# Patient Record
Sex: Female | Born: 1985 | Race: White | Hispanic: No | Smoking: Former smoker
Health system: Southern US, Community
[De-identification: ages and names within clinical notes are randomized; demographics above are authoritative.]

## PROBLEM LIST (undated history)

## (undated) ENCOUNTER — Inpatient Hospital Stay (HOSPITAL_COMMUNITY): Payer: Self-pay

## (undated) DIAGNOSIS — J45909 Unspecified asthma, uncomplicated: Secondary | ICD-10-CM

## (undated) DIAGNOSIS — Z8489 Family history of other specified conditions: Secondary | ICD-10-CM

## (undated) HISTORY — PX: CYSTECTOMY: SUR359

---

## 2007-06-23 ENCOUNTER — Observation Stay: Payer: Self-pay

## 2007-06-28 ENCOUNTER — Inpatient Hospital Stay: Payer: Self-pay | Admitting: Obstetrics and Gynecology

## 2009-01-04 ENCOUNTER — Observation Stay: Payer: Self-pay

## 2009-01-07 ENCOUNTER — Inpatient Hospital Stay: Payer: Self-pay

## 2009-05-11 ENCOUNTER — Emergency Department: Payer: Self-pay | Admitting: Emergency Medicine

## 2010-02-18 ENCOUNTER — Ambulatory Visit: Payer: Self-pay | Admitting: Family Medicine

## 2011-01-01 ENCOUNTER — Emergency Department: Payer: Self-pay | Admitting: Emergency Medicine

## 2012-02-10 ENCOUNTER — Emergency Department: Payer: Self-pay | Admitting: Emergency Medicine

## 2012-02-10 LAB — COMPREHENSIVE METABOLIC PANEL
Albumin: 4.1 g/dL (ref 3.4–5.0)
Alkaline Phosphatase: 102 U/L (ref 50–136)
Anion Gap: 13 (ref 7–16)
BUN: 11 mg/dL (ref 7–18)
Bilirubin,Total: 0.5 mg/dL (ref 0.2–1.0)
Calcium, Total: 9 mg/dL (ref 8.5–10.1)
Chloride: 107 mmol/L (ref 98–107)
Co2: 17 mmol/L — ABNORMAL LOW (ref 21–32)
Creatinine: 0.83 mg/dL (ref 0.60–1.30)
EGFR (African American): >60
EGFR (Non-African Amer.): >60
Glucose: 112 mg/dL — ABNORMAL HIGH (ref 65–99)
Osmolality: 274 (ref 275–301)
Potassium: 3.5 mmol/L (ref 3.5–5.1)
SGOT(AST): 16 U/L (ref 15–37)
SGPT (ALT): 20 U/L (ref 12–78)
Sodium: 137 mmol/L (ref 136–145)
Total Protein: 8.5 g/dL — ABNORMAL HIGH (ref 6.4–8.2)

## 2012-02-10 LAB — CBC
HCT: 43.4 % (ref 35.0–47.0)
HGB: 15.1 g/dL (ref 12.0–16.0)
MCH: 30.8 pg (ref 26.0–34.0)
MCHC: 34.7 g/dL (ref 32.0–36.0)
MCV: 89 fL (ref 80–100)
Platelet: 265 10*3/uL (ref 150–440)
RBC: 4.89 10*6/uL (ref 3.80–5.20)
RDW: 13.4 % (ref 11.5–14.5)
WBC: 13.6 10*3/uL — ABNORMAL HIGH (ref 3.6–11.0)

## 2012-02-10 LAB — URINALYSIS, COMPLETE
Bilirubin,UR: NEGATIVE
Blood: NEGATIVE
Glucose,UR: NEGATIVE mg/dL (ref 0–75)
Ketone: NEGATIVE
Nitrite: NEGATIVE
Specific Gravity: 1.009 (ref 1.003–1.030)
Squamous Epithelial: 1
WBC UR: 2 /HPF (ref 0–5)

## 2012-02-10 LAB — RAPID INFLUENZA A&B ANTIGENS

## 2013-06-23 ENCOUNTER — Emergency Department: Payer: Self-pay | Admitting: Emergency Medicine

## 2013-06-23 LAB — BASIC METABOLIC PANEL
Anion Gap: 4 — ABNORMAL LOW (ref 7–16)
BUN: 8 mg/dL (ref 7–18)
CALCIUM: 9.1 mg/dL (ref 8.5–10.1)
CREATININE: 1.02 mg/dL (ref 0.60–1.30)
Chloride: 106 mmol/L (ref 98–107)
Co2: 28 mmol/L (ref 21–32)
EGFR (Non-African Amer.): >60
Glucose: 90 mg/dL (ref 65–99)
Osmolality: 274 (ref 275–301)
Potassium: 3.5 mmol/L (ref 3.5–5.1)
SODIUM: 138 mmol/L (ref 136–145)

## 2013-06-23 LAB — TROPONIN I: Troponin-I: <0.02 ng/mL

## 2013-06-23 LAB — CBC
HCT: 42.3 % (ref 35.0–47.0)
HGB: 14.4 g/dL (ref 12.0–16.0)
MCH: 30.7 pg (ref 26.0–34.0)
MCHC: 34.1 g/dL (ref 32.0–36.0)
MCV: 90 fL (ref 80–100)
Platelet: 214 10*3/uL (ref 150–440)
RBC: 4.69 10*6/uL (ref 3.80–5.20)
RDW: 13.6 % (ref 11.5–14.5)
WBC: 9.1 10*3/uL (ref 3.6–11.0)

## 2013-06-23 LAB — D-DIMER(ARMC): D-Dimer: 314 ng/ml

## 2013-12-16 ENCOUNTER — Inpatient Hospital Stay (HOSPITAL_COMMUNITY)
Admission: AD | Admit: 2013-12-16 | Discharge: 2013-12-16 | Disposition: A | Discharge status: Home / Self Care | Payer: Medicare Other | Source: Ambulatory Visit | Attending: Obstetrics & Gynecology | Admitting: Obstetrics & Gynecology

## 2013-12-16 ENCOUNTER — Encounter (HOSPITAL_COMMUNITY): Payer: Self-pay

## 2013-12-16 ENCOUNTER — Inpatient Hospital Stay (HOSPITAL_COMMUNITY): Payer: Medicare Other

## 2013-12-16 DIAGNOSIS — O9989 Other specified diseases and conditions complicating pregnancy, childbirth and the puerperium: Principal | ICD-10-CM

## 2013-12-16 DIAGNOSIS — S93402A Sprain of unspecified ligament of left ankle, initial encounter: Secondary | ICD-10-CM

## 2013-12-16 DIAGNOSIS — S8990XA Unspecified injury of unspecified lower leg, initial encounter: Secondary | ICD-10-CM | POA: Insufficient documentation

## 2013-12-16 DIAGNOSIS — W108XXA Fall (on) (from) other stairs and steps, initial encounter: Secondary | ICD-10-CM | POA: Diagnosis not present

## 2013-12-16 DIAGNOSIS — S99919A Unspecified injury of unspecified ankle, initial encounter: Secondary | ICD-10-CM

## 2013-12-16 DIAGNOSIS — Z87891 Personal history of nicotine dependence: Secondary | ICD-10-CM | POA: Insufficient documentation

## 2013-12-16 DIAGNOSIS — S99929A Unspecified injury of unspecified foot, initial encounter: Secondary | ICD-10-CM

## 2013-12-16 DIAGNOSIS — O99891 Other specified diseases and conditions complicating pregnancy: Secondary | ICD-10-CM | POA: Insufficient documentation

## 2013-12-16 NOTE — MAU Provider Note (Signed)
  History     CSN: [id number]  Arrival date and time: 12/16/13 1540   First Provider Initiated Contact with Patient 12/16/13 1900      Chief Complaint  Patient presents with  . Ankle Pain   HPI Ashley Fernandez is a 28 y.o. [id number] at [redacted] wks EGA. She fell off 1 step this am, was ok, now is swollen and tender. [place]NC [place].    OB History   Grav Para Term Preterm Abortions TAB SAB Ect Mult Living   3 2 0 2      2      History reviewed. No pertinent past medical history.  Past Surgical History  Procedure Laterality Date  . Cystectomy      History reviewed. No pertinent family history.  History  Substance Use Topics  . Smoking status: Former Games developer  . Smokeless tobacco: Not on file  . Alcohol Use: Yes     Comment: LAST DRANK  AT 6 WEEKS AGO    Allergies:  Allergies  Allergen Reactions  . Peanuts [Peanut Oil] Anaphylaxis    Prescriptions prior to admission  Medication Sig Dispense Refill  . albuterol (PROVENTIL HFA;VENTOLIN HFA) 108 (90 BASE) MCG/ACT inhaler Inhale 2 puffs into the lungs every 6 (six) hours as needed for wheezing or shortness of breath.      . EPINEPHrine (EPIPEN 2-PAK) 0.3 mg/0.3 mL IJ SOAJ injection Inject 0.3 mg into the muscle once.      . Prenatal Vit-Fe Fumarate-FA (PRENATAL MULTIVITAMIN) TABS tablet Take 1 tablet by mouth daily at 12 noon.      Marland Kitchen PRESCRIPTION MEDICATION Inhale 2 puffs into the lungs 2 (two) times daily. Pt reports using an inhaler twice daily w/spacer, unknown drug.        Review of Systems  Constitutional: Negative for fever and chills.  Gastrointestinal: Negative for abdominal pain.  Genitourinary: Negative for dysuria, urgency and frequency.  Musculoskeletal: Positive for falls.       L ankle tender, swollen   Physical Exam   Blood pressure 113/64, pulse 80, temperature 98.4 F (36.9 C), temperature source Oral, resp. rate 18, weight 104.327 kg (230 lb), last menstrual period 10/01/2013.  Physical Exam   Nursing note and vitals reviewed. Constitutional: She is oriented to person, place, and time. She appears well-developed and well-nourished.  Musculoskeletal:       Left foot: She exhibits decreased range of motion, tenderness and swelling. She exhibits no deformity and no laceration.  Neurological: She is alert and oriented to person, place, and time.  Skin: Skin is warm and dry.  Psychiatric: She has a normal mood and affect. Her behavior is normal.    MAU Course  Procedures  MDM   Assessment and Plan  L ankle injury-not fractured [redacted] wk EGA   Ice, elevate, rest, tylenol Keep appt for pnc If foot gets worse t f/u with PCP  [name] 12/16/2013, 7:05 PM

## 2013-12-16 NOTE — MAU Note (Addendum)
PT  SAYS AT 0900-    AT A HOUSE- SHE  FELL OFF 1 STEP   AND HURT LEFT ANKLE.     DID NOT HIT HER HEAD-  TRIED TO  CATCH HERSELF  WITH HER HANDS.      ON ARRIVAL  LEFT ANKLE SWOLLEN-     SAYS SHE GETS PNC  IN [place]-   LIVES IN [place]

## 2013-12-16 NOTE — MAU Note (Signed)
Pt states here for swollen left ankle. Fell down steps around 0900 this am, landed on abdomen. Went to football game and ankle was ok, however after going back home began having pain and swelling. Denies bleeding or abnormal vaginal discharge. Has had intermittent abdominal cramping.

## 2014-01-04 ENCOUNTER — Encounter: Payer: Self-pay | Admitting: Obstetrics & Gynecology

## 2014-01-30 ENCOUNTER — Encounter (HOSPITAL_COMMUNITY): Payer: Self-pay

## 2014-02-08 ENCOUNTER — Encounter: Payer: Self-pay | Admitting: Obstetrics and Gynecology

## 2014-04-05 ENCOUNTER — Observation Stay: Payer: Self-pay

## 2014-04-05 LAB — URINALYSIS, COMPLETE
Bilirubin,UR: NEGATIVE
Blood: NEGATIVE
GLUCOSE, UR: NEGATIVE mg/dL (ref 0–75)
KETONE: NEGATIVE
NITRITE: NEGATIVE
PH: 5 (ref 4.5–8.0)
Protein: NEGATIVE
RBC,UR: 22 /HPF (ref 0–5)
Specific Gravity: 1.014 (ref 1.003–1.030)
WBC UR: 19 /HPF (ref 0–5)

## 2014-04-19 ENCOUNTER — Encounter: Payer: Self-pay | Admitting: Obstetrics & Gynecology

## 2014-05-15 ENCOUNTER — Observation Stay: Payer: Self-pay

## 2014-06-18 ENCOUNTER — Observation Stay: Payer: Self-pay

## 2014-06-27 ENCOUNTER — Observation Stay
Admit: 2014-06-27 | Disposition: A | Discharge status: Home / Self Care | Payer: Self-pay | Attending: Certified Nurse Midwife | Admitting: Certified Nurse Midwife

## 2014-06-27 LAB — PIH PROFILE
Anion Gap: 8 (ref 7–16)
BUN: 8 mg/dL
Calcium, Total: 8.7 mg/dL — ABNORMAL LOW
Chloride: 108 mmol/L
Co2: 22 mmol/L
Creatinine: 0.58 mg/dL
EGFR (African American): >60
EGFR (Non-African Amer.): >60
GLUCOSE: 130 mg/dL — AB
HCT: 37.1 % (ref 35.0–47.0)
HGB: 12.4 g/dL (ref 12.0–16.0)
MCH: 31.4 pg (ref 26.0–34.0)
MCHC: 33.5 g/dL (ref 32.0–36.0)
MCV: 94 fL (ref 80–100)
POTASSIUM: 3.6 mmol/L
Platelet: 204 10*3/uL (ref 150–440)
RBC: 3.97 10*6/uL (ref 3.80–5.20)
RDW: 14.2 % (ref 11.5–14.5)
SGOT(AST): 24 U/L
Sodium: 138 mmol/L
Uric Acid: 4.2 mg/dL
WBC: 9.2 10*3/uL (ref 3.6–11.0)

## 2014-06-27 LAB — PROTEIN / CREATININE RATIO, URINE
Creatinine, Urine Random: 177 mg/dL (ref 30–125)
PROTEIN/CREAT. RATIO: 130 mg/g{creat} (ref 0–200)
Protein, Urine: 23 mg/dL (ref 0–9)

## 2014-07-09 ENCOUNTER — Observation Stay
Admit: 2014-07-09 | Disposition: A | Discharge status: Home / Self Care | Payer: Self-pay | Attending: Advanced Practice Midwife | Admitting: Advanced Practice Midwife

## 2014-07-09 LAB — PROTEIN / CREATININE RATIO, URINE
CREATININE, URINE RANDOM: 247 mg/dL (ref 30–125)
PROTEIN, URINE: 43 mg/dL (ref 0–9)
PROTEIN/CREAT. RATIO: 174 mg/g{creat} (ref 0–200)

## 2014-07-09 LAB — PIH PROFILE
Anion Gap: 7 (ref 7–16)
BUN: 7 mg/dL
CALCIUM: 8.7 mg/dL — AB
CO2: 25 mmol/L
CREATININE: 0.63 mg/dL
Chloride: 105 mmol/L
EGFR (African American): >60
GLUCOSE: 124 mg/dL — AB
HCT: 37.2 % (ref 35.0–47.0)
HGB: 12.5 g/dL (ref 12.0–16.0)
MCH: 31.7 pg (ref 26.0–34.0)
MCHC: 33.7 g/dL (ref 32.0–36.0)
MCV: 94 fL (ref 80–100)
Platelet: 193 10*3/uL (ref 150–440)
Potassium: 3.5 mmol/L
RBC: 3.95 10*6/uL (ref 3.80–5.20)
RDW: 14.4 % (ref 11.5–14.5)
SGOT(AST): 17 U/L
SODIUM: 137 mmol/L
URIC ACID: 4.2 mg/dL
WBC: 9.6 10*3/uL (ref 3.6–11.0)

## 2014-07-10 ENCOUNTER — Inpatient Hospital Stay
Admit: 2014-07-10 | Disposition: A | Discharge status: Home / Self Care | Payer: Self-pay | Attending: Certified Nurse Midwife | Admitting: Certified Nurse Midwife

## 2014-07-10 LAB — CBC WITH DIFFERENTIAL/PLATELET
Basophil #: 0 10*3/uL (ref 0.0–0.1)
Basophil %: 0.2 %
EOS PCT: 0.3 %
Eosinophil #: 0 10*3/uL (ref 0.0–0.7)
HCT: 36.6 % (ref 35.0–47.0)
HGB: 12.3 g/dL (ref 12.0–16.0)
LYMPHS ABS: 1.3 10*3/uL (ref 1.0–3.6)
Lymphocyte %: 13 %
MCH: 31.4 pg (ref 26.0–34.0)
MCHC: 33.6 g/dL (ref 32.0–36.0)
MCV: 93 fL (ref 80–100)
MONO ABS: 0.5 x10 3/mm (ref 0.2–0.9)
Monocyte %: 4.9 %
NEUTROS ABS: 8.2 10*3/uL — AB (ref 1.4–6.5)
Neutrophil %: 81.6 %
Platelet: 194 10*3/uL (ref 150–440)
RBC: 3.92 10*6/uL (ref 3.80–5.20)
RDW: 14.5 % (ref 11.5–14.5)
WBC: 10 10*3/uL (ref 3.6–11.0)

## 2014-07-12 LAB — HEMATOCRIT: HCT: 33.8 % — ABNORMAL LOW (ref 35.0–47.0)

## 2014-08-07 NOTE — H&P (Signed)
L&D Evaluation:  History:  HPI Pt is a 29 yo G3P2002 pt of [facility] at 2684w1d by a 2952w6d scan with an EDC of 07/08/14 who presents to L&D with reports of decreased fetal movement. She report not doing any fetal movement counts. She denies vb, lof.  She denies dysuria, vomiting, fever, chills, lof, and back pain.  Her history is significant for learning disability, BMI of 36, asthma, +CT and +GC with negative TOC. She is A+, RI, VI.   Presents with abdominal pain   Patient's Medical History Asthma  learning disability, obesity, allergies   Patient's Surgical History cyst removal   Medications Pre Natal Vitamins   Allergies peanuts   Social History none   Family History Non-Contributory   ROS:  ROS All systems were reviewed.  HEENT, CNS, GI, GU, Respiratory, CV, Renal and Musculoskeletal systems were found to be normal.   Exam:  Vital Signs stable   General no apparent distress   Mental Status clear   Chest clear   Heart normal sinus rhythm   Abdomen gravid, non-tender   Mebranes Intact   FHT normal rate with no decels, 135, moderate variability, +accels, no decels   Ucx irregular, very mild   Skin dry, no lesions, no rashes   Lymph no lymphadenopathy   Impression:  Impression reactive NST, IUP at 37.1, Cat 1 FHT   Plan:  Plan UA, discharge, FKC discussedd with pt   Follow Up Appointment already scheduled. in 1 week   Electronic Signatures: Jannet MantisSubudhi, Jarrod Mcenery (CNM)  (Signed 21-Mar-16 20:13)  Authored: L&D Evaluation   Last Updated: 21-Mar-16 20:13 by Jannet MantisSubudhi, Tiziana Cislo (CNM)

## 2014-08-07 NOTE — H&P (Signed)
L&D Evaluation:  History:  HPI Pt is a 29 yo G3P2002 pt of [facility] at 3567w4d by a 3739w6d scan with an EDC of 07/08/14 who presents to L&D with reports of worsening middle abdominal pain, lack of sleep, and pain from the "baby stretching." She reports that she has never felt contractions with her other 2 children because her labors were induced and she had an epidural. She denies dysuria, vomiting, fever, chills, lof, and back pain. She reports + FM and nausea. She states that she "just can't get any sleep" and she has tried the "sleep tea the doctor gave me." She reports drinking fluid and laying down on her side to see if the abdominal tightening would go away but is still keeps happening intermittently.  Her history is significant for learning disability, BMI of 36, asthma, +CT and +GC with negative TOC. She is A+, RI, VI.   Presents with abdominal pain   Patient's Medical History Asthma  learning disability, obesity, allergies   Patient's Surgical History cyst removal   Medications Pre Natal Vitamins   Allergies peanuts   Social History none   Family History Non-Contributory   ROS:  ROS All systems were reviewed.  HEENT, CNS, GI, GU, Respiratory, CV, Renal and Musculoskeletal systems were found to be normal.   Exam:  Vital Signs stable   General no apparent distress   Mental Status clear   Chest clear   Heart normal sinus rhythm   Abdomen gravid, non-tender   Mebranes Intact   FHT normal rate with no decels, 140-141   Ucx absent   Skin dry, no lesions, no rashes   Lymph no lymphadenopathy   Impression:  Impression IUP at 26.4, probable braxton hicks contractions, R/O UTI   Plan:  Plan UA, comfort measures for sleep, and braxton hicks discussed. Encouraged pt to call the office to be seen for non-emergent visits.   Follow Up Appointment already scheduled   Electronic Signatures: Jannet MantisSubudhi, Mylz Yuan (CNM)  (Signed 07-Jan-16 11:14)  Authored: L&D  Evaluation   Last Updated: 07-Jan-16 11:14 by Jannet MantisSubudhi, Haadiya Frogge (CNM)

## 2014-08-07 NOTE — H&P (Signed)
L&D Evaluation:  History Expanded:  HPI Pt is a 29 yo G3P2002 pt of [facility] at 659w1d with EDD of 07/08/14 per LMP and 3016w6d scan who presents to L&D after being sent over from [facility] with elevated bp of 140/90 and 1+ protein urine dip. She denies HA, blurred vision, RUQ pain. She also reports decreased fetal movement. Her history is significant for learning disability, BMI of 36, asthma, +CT and +GC with negative TOC. She is A+, RI, VI, GBS positive   Presents with PIH work up   Patient's Medical History Asthma  learning disability, obesity, allergies   Patient's Surgical History cyst removal   Medications Pre Natal Vitamins   Allergies peanuts   Social History none   Family History Non-Contributory   ROS:  ROS All systems were reviewed.  HEENT, CNS, GI, GU, Respiratory, CV, Renal and Musculoskeletal systems were found to be normal.   Exam:  Vital Signs stable  BP 125-134/70-81   Urine Protein trace   General no apparent distress   Mental Status clear   Chest clear   Heart normal sinus rhythm   Abdomen gravid, non-tender   Edema no edema   Reflexes 2+   Pelvic no external lesions, 2-3/60/-2   Mebranes Intact   FHT normal rate with no decels, 135-140, moderate variability, +accels, no decels   Ucx irregular, occassional   Skin dry, no lesions, no rashes   Lymph no lymphadenopathy   Other H&H- 12.5/37.2, plt- 193, sgot- 17, uric acid- 4.2, pc ratio 174   Impression:  Impression reactive NST, IUP at 2559w1d, no evidence of hypertension or pre-eclampsia   Plan:  Comments Discharge home - pre-eclampsia precautions F/u as scheduled at office for IOL H&P - put on L&D schedule for 4/16 am   Follow Up Appointment already scheduled. Thursday at [facility]   Electronic Signatures: Vella KohlerBrothers, Landy Dunnavant K (CNM)  (Signed 11-Apr-16 15:14)  Authored: L&D Evaluation   Last Updated: 11-Apr-16 15:14 by Vella KohlerBrothers, Wendy Hoback K (CNM)
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
## 2014-08-07 NOTE — H&P (Signed)
L&D Evaluation:  History:  HPI Pt is a 29 yo G3P2002 pt of [facility] at 6564w3d by a 1839w6d scan with an EDC of 07/08/14 who presents to L&D after being sent over from [facility] with elevated bp of 134/95. She denies HA, blurred vision, RUQ pain. She also reports decreased fetal movement. Her history is significant for learning disability, BMI of 36, asthma, +CT and +GC with negative TOC. She is A+, RI, VI.   Presents with PIH work up   Patient's Medical History Asthma  learning disability, obesity, allergies   Patient's Surgical History cyst removal   Medications Pre Natal Vitamins   Allergies peanuts   Social History none   Family History Non-Contributory   ROS:  ROS All systems were reviewed.  HEENT, CNS, GI, GU, Respiratory, CV, Renal and Musculoskeletal systems were found to be normal.   Exam:  Vital Signs stable  bp 92-129/48-100  one BP 116/100 pt was thrashing in the bed.   General no apparent distress   Mental Status clear   Chest clear   Heart normal sinus rhythm   Abdomen gravid, non-tender   Reflexes 2+   Mebranes Intact   FHT normal rate with no decels, 135-140, moderate variability, +accels, no decels   Ucx irregular, occassional   Skin dry, no lesions, no rashes   Lymph no lymphadenopathy   Other H&H- 12.4/37.1, plt- 204, sgot- 24, uric acid- 4.2, pc ratio 0.13   Impression:  Impression reactive NST, IUP at 38.3, Cat 1 FHT, isolated elevated BP reading   Plan:  Plan UA, discharge, FKC discussedd with pt   Comments discussed s/sx of pre eclampsia. Discussed dx of GHTN requires 2 elevated BP 4 hours apart. Pt to have appt with [facility] tomorrow. If BP elevated- will need to be set up for IOL for GHTN.   Follow Up Appointment already scheduled. tomorrow with [facility]   Electronic Signatures: Jannet MantisSubudhi, Deeana Atwater (CNM)  (Signed 30-Mar-16 18:49)  Authored: L&D Evaluation   Last Updated: 30-Mar-16 18:49 by Jannet MantisSubudhi, Kennedey Digilio (CNM)

## 2015-01-14 ENCOUNTER — Other Ambulatory Visit: Payer: Medicare Other

## 2015-01-15 ENCOUNTER — Other Ambulatory Visit: Payer: Medicare Other

## 2015-01-22 ENCOUNTER — Encounter
Admission: RE | Admit: 2015-01-22 | Discharge: 2015-01-22 | Disposition: A | Discharge status: Home / Self Care | Payer: Medicare Other | Source: Ambulatory Visit | Attending: Obstetrics and Gynecology | Admitting: Obstetrics and Gynecology

## 2015-01-22 DIAGNOSIS — N9971 Accidental puncture and laceration of a genitourinary system organ or structure during a genitourinary system procedure: Secondary | ICD-10-CM | POA: Diagnosis not present

## 2015-01-22 DIAGNOSIS — Z6841 Body Mass Index (BMI) 40.0 and over, adult: Secondary | ICD-10-CM | POA: Diagnosis not present

## 2015-01-22 DIAGNOSIS — Z87891 Personal history of nicotine dependence: Secondary | ICD-10-CM | POA: Diagnosis not present

## 2015-01-22 DIAGNOSIS — J45909 Unspecified asthma, uncomplicated: Secondary | ICD-10-CM

## 2015-01-22 DIAGNOSIS — N83209 Unspecified ovarian cyst, unspecified side: Secondary | ICD-10-CM | POA: Diagnosis present

## 2015-01-22 DIAGNOSIS — E669 Obesity, unspecified: Secondary | ICD-10-CM | POA: Diagnosis not present

## 2015-01-22 DIAGNOSIS — Z302 Encounter for sterilization: Secondary | ICD-10-CM | POA: Diagnosis not present

## 2015-01-22 HISTORY — DX: Family history of other specified conditions: Z84.89

## 2015-01-22 HISTORY — DX: Unspecified asthma, uncomplicated: J45.909

## 2015-01-22 LAB — BASIC METABOLIC PANEL
Anion gap: 8 (ref 5–15)
BUN: 10 mg/dL (ref 6–20)
CALCIUM: 8.9 mg/dL (ref 8.9–10.3)
CO2: 25 mmol/L (ref 22–32)
Chloride: 110 mmol/L (ref 101–111)
Creatinine, Ser: 0.91 mg/dL (ref 0.44–1.00)
Glucose, Bld: 96 mg/dL (ref 65–99)
Potassium: 3.4 mmol/L — ABNORMAL LOW (ref 3.5–5.1)
SODIUM: 143 mmol/L (ref 135–145)

## 2015-01-22 LAB — CBC
HCT: 39.9 % (ref 35.0–47.0)
Hemoglobin: 13.4 g/dL (ref 12.0–16.0)
MCH: 30 pg (ref 26.0–34.0)
MCHC: 33.5 g/dL (ref 32.0–36.0)
MCV: 89.7 fL (ref 80.0–100.0)
PLATELETS: 230 10*3/uL (ref 150–440)
RBC: 4.45 MIL/uL (ref 3.80–5.20)
RDW: 13.4 % (ref 11.5–14.5)
WBC: 8.1 10*3/uL (ref 3.6–11.0)

## 2015-01-22 NOTE — Patient Instructions (Signed)
  Your procedure is scheduled on: 01/25/15 ARRIVAL TIME 1:00 PM Report to Day Surgery. MEDICAL MALL  Remember: Instructions that are not followed completely may result in serious medical risk, up to and including death, or upon the discretion of your surgeon and anesthesiologist your surgery may need to be rescheduled.    _X___ 1. Do not eat food or drink liquids after midnight. No gum chewing or hard candies.     _X___ 2. No Alcohol for 24 hours before or after surgery.   ____ 3. Bring all medications with you on the day of surgery if instructed.    ____ 4. Notify your doctor if there is any change in your medical condition     (cold, fever, infections).     Do not wear jewelry, make-up, hairpins, clips or nail polish.  Do not wear lotions, powders, or perfumes. You may wear deodorant.  Do not shave 48 hours prior to surgery. Men may shave face and neck.  Do not bring valuables to the hospital.    [facility] not [facility] for any belongings or valuables.               Contacts, dentures or bridgework may not be worn into surgery.  Leave your suitcase in the car. After surgery it may be brought to your room.  For patients admitted to the hospital, discharge time is determined by your                treatment team.   Patients discharged the day of surgery will not be allowed to drive home.   Please read over the following fact sheets that you were given:   Surgical Site Infection Prevention   ____ Take these medicines the morning of surgery with A SIP OF WATER:    1. NONE  2.   3.   4.  5.  6.  ____ Fleet Enema (as directed)   __X__ Use CHG Soap as directed  _X___ Use inhalers on the day of surgery  ____ Stop metformin 2 days prior to surgery    ____ Take 1/2 of usual insulin dose the night before surgery and none on the morning of surgery.   ____ Stop Coumadin/Plavix/aspirin on  ____ Stop Anti-inflammatories on   ____ Stop supplements until after surgery.     ____ Bring C-Pap to the hospital.

## 2015-01-25 ENCOUNTER — Ambulatory Visit: Payer: Medicare Other

## 2015-01-25 ENCOUNTER — Ambulatory Visit: Payer: Medicare Other | Admitting: *Deleted

## 2015-01-25 ENCOUNTER — Encounter: Payer: Self-pay | Admitting: *Deleted

## 2015-01-25 ENCOUNTER — Ambulatory Visit: Admission: RE | Admit: 2015-01-25 | Payer: Medicare Other | Source: Ambulatory Visit

## 2015-01-25 ENCOUNTER — Encounter
Admission: RE | Disposition: A | Discharge status: Home / Self Care | Payer: Self-pay | Source: Ambulatory Visit | Attending: Obstetrics and Gynecology

## 2015-01-25 ENCOUNTER — Ambulatory Visit
Admission: RE | Admit: 2015-01-25 | Discharge: 2015-01-25 | Disposition: A | Discharge status: Home / Self Care | Payer: Medicare Other | Source: Ambulatory Visit | Attending: Obstetrics and Gynecology | Admitting: Obstetrics and Gynecology

## 2015-01-25 DIAGNOSIS — Z01818 Encounter for other preprocedural examination: Secondary | ICD-10-CM

## 2015-01-25 DIAGNOSIS — N83209 Unspecified ovarian cyst, unspecified side: Secondary | ICD-10-CM | POA: Insufficient documentation

## 2015-01-25 DIAGNOSIS — Z6841 Body Mass Index (BMI) 40.0 and over, adult: Secondary | ICD-10-CM | POA: Insufficient documentation

## 2015-01-25 DIAGNOSIS — Z87891 Personal history of nicotine dependence: Secondary | ICD-10-CM | POA: Insufficient documentation

## 2015-01-25 DIAGNOSIS — E669 Obesity, unspecified: Secondary | ICD-10-CM | POA: Insufficient documentation

## 2015-01-25 DIAGNOSIS — Z302 Encounter for sterilization: Secondary | ICD-10-CM | POA: Diagnosis not present

## 2015-01-25 DIAGNOSIS — N9971 Accidental puncture and laceration of a genitourinary system organ or structure during a genitourinary system procedure: Secondary | ICD-10-CM | POA: Insufficient documentation

## 2015-01-25 DIAGNOSIS — J45909 Unspecified asthma, uncomplicated: Secondary | ICD-10-CM | POA: Insufficient documentation

## 2015-01-25 HISTORY — PX: LAPAROSCOPIC TUBAL LIGATION: SHX1937

## 2015-01-25 LAB — POCT PREGNANCY, URINE: PREG TEST UR: NEGATIVE

## 2015-01-25 SURGERY — LIGATION, FALLOPIAN TUBE, LAPAROSCOPIC
Anesthesia: General | Laterality: Bilateral | Wound class: Clean Contaminated

## 2015-01-25 MED ORDER — SILVER NITRATE-POT NITRATE 75-25 % EX MISC
CUTANEOUS | Status: DC | PRN
  Administered 2015-01-25: 5

## 2015-01-25 MED ORDER — IBUPROFEN 600 MG PO TABS
ORAL_TABLET | ORAL | Status: AC
  Filled 2015-01-25: qty 1

## 2015-01-25 MED ORDER — LACTATED RINGERS IV SOLN
INTRAVENOUS | Status: DC
  Administered 2015-01-25: 13:00:00 via INTRAVENOUS

## 2015-01-25 MED ORDER — SUCCINYLCHOLINE CHLORIDE 20 MG/ML IJ SOLN
INTRAMUSCULAR | Status: DC | PRN
  Administered 2015-01-25: 100 mg via INTRAVENOUS

## 2015-01-25 MED ORDER — ROCURONIUM BROMIDE 100 MG/10ML IV SOLN
INTRAVENOUS | Status: DC | PRN
  Administered 2015-01-25: 10 mg via INTRAVENOUS
  Administered 2015-01-25: 20 mg via INTRAVENOUS

## 2015-01-25 MED ORDER — FENTANYL CITRATE (PF) 100 MCG/2ML IJ SOLN
INTRAMUSCULAR | Status: AC
  Filled 2015-01-25: qty 2

## 2015-01-25 MED ORDER — ONDANSETRON HCL 4 MG/2ML IJ SOLN: 4.0000 mg | Freq: Once | INTRAMUSCULAR | Status: DC | PRN

## 2015-01-25 MED ORDER — FENTANYL CITRATE (PF) 250 MCG/5ML IJ SOLN
INTRAMUSCULAR | Status: DC | PRN
  Administered 2015-01-25: 100 ug via INTRAVENOUS
  Administered 2015-01-25: 50 ug via INTRAVENOUS

## 2015-01-25 MED ORDER — MIDAZOLAM HCL 5 MG/5ML IJ SOLN
INTRAMUSCULAR | Status: DC | PRN
  Administered 2015-01-25: 2 mg via INTRAVENOUS

## 2015-01-25 MED ORDER — HYDROMORPHONE HCL 1 MG/ML IJ SOLN
INTRAMUSCULAR | Status: AC
  Administered 2015-01-25: 0.5 mg via INTRAVENOUS
  Filled 2015-01-25: qty 1

## 2015-01-25 MED ORDER — ALBUTEROL SULFATE HFA 108 (90 BASE) MCG/ACT IN AERS
INHALATION_SPRAY | RESPIRATORY_TRACT | Status: DC | PRN
  Administered 2015-01-25 (×3): 4 via RESPIRATORY_TRACT

## 2015-01-25 MED ORDER — HYDROMORPHONE HCL 1 MG/ML IJ SOLN
0.2500 mg | INTRAMUSCULAR | Status: DC | PRN
  Administered 2015-01-25 (×4): 0.5 mg via INTRAVENOUS

## 2015-01-25 MED ORDER — BUPIVACAINE HCL 0.5 % IJ SOLN
INTRAMUSCULAR | Status: DC | PRN
  Administered 2015-01-25: 15 mL

## 2015-01-25 MED ORDER — DEXAMETHASONE SODIUM PHOSPHATE 10 MG/ML IJ SOLN
INTRAMUSCULAR | Status: DC | PRN
  Administered 2015-01-25: 10 mg via INTRAVENOUS

## 2015-01-25 MED ORDER — ALBUTEROL SULFATE (2.5 MG/3ML) 0.083% IN NEBU
INHALATION_SOLUTION | RESPIRATORY_TRACT | Status: AC
  Administered 2015-01-25: 2.5 mg via RESPIRATORY_TRACT
  Filled 2015-01-25: qty 3

## 2015-01-25 MED ORDER — FAMOTIDINE 20 MG PO TABS: 20.0000 mg | ORAL_TABLET | Freq: Once | ORAL | Status: DC

## 2015-01-25 MED ORDER — OXYCODONE-ACETAMINOPHEN 5-325 MG PO TABS: 1.0000 | ORAL_TABLET | Freq: Four times a day (QID) | ORAL | Status: AC | PRN

## 2015-01-25 MED ORDER — PROPOFOL 10 MG/ML IV BOLUS
INTRAVENOUS | Status: DC | PRN
  Administered 2015-01-25: 200 mg via INTRAVENOUS

## 2015-01-25 MED ORDER — ACETAMINOPHEN 10 MG/ML IV SOLN
INTRAVENOUS | Status: DC | PRN
  Administered 2015-01-25: 1000 mg via INTRAVENOUS

## 2015-01-25 MED ORDER — ONDANSETRON HCL 4 MG/2ML IJ SOLN
INTRAMUSCULAR | Status: DC | PRN
  Administered 2015-01-25: 4 mg via INTRAVENOUS

## 2015-01-25 MED ORDER — LIDOCAINE HCL (CARDIAC) 20 MG/ML IV SOLN
INTRAVENOUS | Status: DC | PRN
  Administered 2015-01-25: 80 mg via INTRAVENOUS
  Administered 2015-01-25: 100 mg via INTRAVENOUS

## 2015-01-25 MED ORDER — IBUPROFEN 200 MG PO TABS: 200.0000 mg | ORAL_TABLET | Freq: Four times a day (QID) | ORAL | Status: AC | PRN

## 2015-01-25 MED ORDER — BUPIVACAINE HCL (PF) 0.5 % IJ SOLN
INTRAMUSCULAR | Status: AC
  Filled 2015-01-25: qty 30

## 2015-01-25 MED ORDER — OXYCODONE-ACETAMINOPHEN 5-325 MG PO TABS: 1.0000 | ORAL_TABLET | Freq: Four times a day (QID) | ORAL | Status: DC | PRN

## 2015-01-25 MED ORDER — SUGAMMADEX SODIUM 200 MG/2ML IV SOLN
INTRAVENOUS | Status: DC | PRN
  Administered 2015-01-25: 223.2 mg via INTRAVENOUS

## 2015-01-25 MED ORDER — IBUPROFEN 600 MG PO TABS: 600.0000 mg | ORAL_TABLET | Freq: Four times a day (QID) | ORAL | Status: DC | PRN

## 2015-01-25 MED ORDER — ALBUTEROL SULFATE (2.5 MG/3ML) 0.083% IN NEBU
2.5000 mg | INHALATION_SOLUTION | Freq: Once | RESPIRATORY_TRACT | Status: AC
  Administered 2015-01-25: 2.5 mg via RESPIRATORY_TRACT

## 2015-01-25 MED ORDER — ACETAMINOPHEN 10 MG/ML IV SOLN
INTRAVENOUS | Status: AC
  Filled 2015-01-25: qty 100

## 2015-01-25 SURGICAL SUPPLY — 35 items
BLADE SURG SZ11 CARB STEEL (BLADE) ×3 IMPLANT
CATH ROBINSON RED A/P 16FR (CATHETERS) ×3 IMPLANT
CHLORAPREP W/TINT 26ML (MISCELLANEOUS) ×3 IMPLANT
CLIP FILSHIE TUBAL LIGA STRL (Clip) ×3 IMPLANT
COVER MAYO STAND STRL (DRAPES) ×3 IMPLANT
DRSG TEGADERM 2-3/8X2-3/4 SM (GAUZE/BANDAGES/DRESSINGS) ×6 IMPLANT
DRSG TELFA 3X8 NADH (GAUZE/BANDAGES/DRESSINGS) ×3 IMPLANT
GAUZE STRETCH 2X75IN STRL (MISCELLANEOUS) ×6 IMPLANT
GLOVE BIO SURGEON STRL SZ7 (GLOVE) ×3 IMPLANT
GLOVE INDICATOR 7.5 STRL GRN (GLOVE) ×3 IMPLANT
GOWN STRL REUS W/ TWL LRG LVL3 (GOWN DISPOSABLE) ×1 IMPLANT
GOWN STRL REUS W/ TWL XL LVL3 (GOWN DISPOSABLE) ×1 IMPLANT
GOWN STRL REUS W/TWL LRG LVL3 (GOWN DISPOSABLE) ×2
GOWN STRL REUS W/TWL XL LVL3 (GOWN DISPOSABLE) ×2
GRASPER SUT TROCAR 14GX15 (MISCELLANEOUS) ×3 IMPLANT
KIT RM TURNOVER CYSTO AR (KITS) ×3 IMPLANT
LABEL OR SOLS (LABEL) IMPLANT
LIQUID BAND (GAUZE/BANDAGES/DRESSINGS) ×3 IMPLANT
NDL INSUFF 14G 150MM VS150000 (NEEDLE) ×3 IMPLANT
NEEDLE VERESS 14GA 120MM (NEEDLE) IMPLANT
NS IRRIG 500ML POUR BTL (IV SOLUTION) ×3 IMPLANT
PACK GYN LAPAROSCOPIC (MISCELLANEOUS) ×3 IMPLANT
PAD OB MATERNITY 4.3X12.25 (PERSONAL CARE ITEMS) ×3 IMPLANT
PAD PREP 24X41 OB/GYN DISP (PERSONAL CARE ITEMS) ×3 IMPLANT
SUT MNCRL AB 4-0 PS2 18 (SUTURE) ×3 IMPLANT
SUT VIC AB 3-0 SH 27 (SUTURE) ×2
SUT VIC AB 3-0 SH 27X BRD (SUTURE) ×1 IMPLANT
SUT VIC AB 4-0 PS2 18 (SUTURE) ×3 IMPLANT
SUT VICRYL 0 AB UR-6 (SUTURE) ×3 IMPLANT
TROCAR BLUNT TIP 12MM OMST12BT (TROCAR) ×3 IMPLANT
TROCAR ENDO BLADELESS 11MM (ENDOMECHANICALS) IMPLANT
TROCAR VERSASTEP 12M LG PL (TROCAR) ×3 IMPLANT
TROCAR VERSASTEP PLUS 12MM (TROCAR) ×3 IMPLANT
TROCAR XCEL NON-BLD 11X100MML (ENDOMECHANICALS) IMPLANT
TUBING INSUFFLATOR HI FLOW (MISCELLANEOUS) ×3 IMPLANT

## 2015-01-25 NOTE — Anesthesia Postprocedure Evaluation (Signed)
  Anesthesia Post-op Note  Patient: Ashley StadeMisty Baggerly  Procedure(s) Performed: Procedure(s): LAPAROSCOPIC TUBAL LIGATION (Bilateral)  Anesthesia type:General  Patient location: PACU  Post pain: Pain level controlled  Post assessment: Post-op Vital signs reviewed, Patient's Cardiovascular Status Stable, Respiratory Function Stable, Patent Airway and No signs of Nausea or vomiting  Post vital signs: Reviewed and stable  Last Vitals:  Filed Vitals:   01/25/15 1515  BP: 127/90  Pulse: 89  Temp: 36.4 C  Resp: 11    Level of consciousness: awake, alert  and patient cooperative  Complications: No apparent anesthesia complications

## 2015-01-25 NOTE — Discharge Instructions (Addendum)
[facility] Laparoscopic Surgery Discharge Instructions  Instructions Following Laparoscopic Surgery You have just undergone a major laparoscopic surgery.  The following list should answer your most common questions.  Although we will discuss your surgery and post-operative instructions with you prior to your discharge, this list will serve as a reminder if you fail to recall the details of what we discussed.  We will discuss your surgery once again in detail at your post-op visit in two to four weeks. If you havent already done so, please call to make your appointment as soon as possible.  How you will feel: Although you have just undergone a major surgery, your recovery will be significantly shorter since the surgery was performed through much smaller incisions than the traditional approach.  You should feel slightly better each day.  If you suddenly feel much worse than the prior day, please call the clinic.  Its important during the early part of your recovery that you maintain some activity.  Walking is encouraged.  You will quicken your recovery by continued activity.  Incision:  Your incisions will be closed with dissolvable stitches or surgical adhesive (glue).  There may be Band-aids and/or Steri-strips covering your incisions.  If there is no drainage from the incisions you may remove the Band-aids in one to two days.  You may notice some minor bruising at the incision sites.  This is common and will resolve within several days.  Please inform us if the redness at the edges of your incision appears to be spreading.  If the skin around your incision becomes warm to the touch, or if you notice a pus-like drainage, please call the office.  Stairs/Driving/Activities: You may climb stairs if necessary.  If youve had general anesthesia, do not drive a car the rest of the day today.  You may begin light housework when you feel up to it, but avoid heavy lifting (more than 15-20lbs) or pushing  until cleared for these activities by your physician.  Hygiene:  Do not soak your incisions.  Showers are acceptable but you may not take a bath or swim in a pool.  Cleanse your incisions daily with soap and water.  Medications:  Please resume taking any medications that you were taking prior to the surgery.  If we have prescribed any new medications for you, please take them as directed.  Constipation:  It is fairly common to experience some difficulty in moving your bowels following major surgery.  Being active will help to reduce this likelihood. A diet rich in fiber and plenty of liquids is desirable.  If you do become constipated, a mild laxative such as Miralax, Milk of Magnesia, or Metamucil, or a stool softener such as Colace, is recommended.  General Instructions: If you develop a fever of 100.5 degrees or higher, please call the office number(s) below for physician on call.    We will discuss your surgery once again in detail at your post-op visit in two to four weeks. If you havent already done so, please call to make your appointment as soon as possible.  [facility]  [address]  [place], [place] [address] [place], [place] [address]  Phone: [phone] Phone: [phone]  Fax: [phone] Fax: [phone]        General Anesthesia, Adult General anesthesia is a sleep-like state of non-feeling produced by medicines (anesthetics). General anesthesia prevents you from being alert and feeling pain during a medical procedure. Your caregiver may recommend general anesthesia  if your procedure:  Is long.  Is painful or uncomfortable.  Would be frightening to see or hear.  Requires you to be still.  Affects your breathing.  Causes significant blood loss. LET YOUR CAREGIVER KNOW ABOUT:  Allergies to food or medicine.  Medicines taken, including vitamins, herbs, eyedrops, over-the-counter medicines, and creams.  Use of steroids (by  mouth or creams).  Previous problems with anesthetics or numbing medicines, including problems experienced by relatives.  History of bleeding problems or blood clots.  Previous surgeries and types of anesthetics received.  Possibility of pregnancy, if this applies.  Use of cigarettes, alcohol, or illegal drugs.  Any health condition(s), especially diabetes, sleep apnea, and high blood pressure. RISKS AND COMPLICATIONS General anesthesia rarely causes complications. However, if complications do occur, they can be life threatening. Complications include:  A lung infection.  A stroke.  A heart attack.  Waking up during the procedure. When this occurs, the patient may be unable to move and communicate that he or she is awake. The patient may feel severe pain. Older adults and adults with serious medical problems are more likely to have complications than adults who are young and healthy. Some complications can be prevented by answering all of your caregiver's questions thoroughly and by following all pre-procedure instructions. It is important to tell your caregiver if any of the pre-procedure instructions, especially those related to diet, were not followed. Any food or liquid in the stomach can cause problems when you are under general anesthesia. BEFORE THE PROCEDURE  Ask your caregiver if you will have to spend the night at the hospital. If you will not have to spend the night, arrange to have an adult drive you and stay with you for 24 hours.  Follow your caregiver's instructions if you are taking dietary supplements or medicines. Your caregiver may tell you to stop taking them or to reduce your dosage.  Do not smoke for as long as possible before your procedure. If possible, stop smoking 3-6 weeks before the procedure.  Do not take new dietary supplements or medicines within 1 week of your procedure unless your caregiver approves them.  Do not eat within 8 hours of your procedure  or as directed by your caregiver. Drink only clear liquids, such as water, black coffee (without milk or cream), and fruit juices (without pulp).  Do not drink within 3 hours of your procedure or as directed by your caregiver.  You may brush your teeth on the morning of the procedure, but make sure to spit out the toothpaste and water when finished. PROCEDURE  You will receive anesthetics through a mask, through an intravenous (IV) access tube, or through both. A doctor who specializes in anesthesia (anesthesiologist) or a nurse who specializes in anesthesia (nurse anesthetist) or both will stay with you throughout the procedure to make sure you remain unconscious. He or she will also watch your blood pressure, pulse, and oxygen levels to make sure that the anesthetics do not cause any problems. Once you are asleep, a breathing tube or mask may be used to help you breathe. AFTER THE PROCEDURE You will wake up after the procedure is complete. You may be in the room where the procedure was performed or in a recovery area. You may have a sore throat if a breathing tube was used. You may also feel:  Dizzy.  Weak.  Drowsy.  Confused.  Nauseous.  Cold. These are all normal responses and can be expected to last for  up to 24 hours after the procedure is complete. A caregiver will tell you when you are ready to go home. This will usually be when you are fully awake and in stable condition.   This information is not intended to replace advice given to you by your health care provider. Make sure you discuss any questions you have with your health care provider.   Document Released: 06/23/2007 Document Revised: 04/06/2014 Document Reviewed: 07/15/2011 Elsevier Interactive Patient Education 2016 [employer]  General Anesthesia, Adult General anesthesia is a sleep-like state of non-feeling produced by medicines (anesthetics). General anesthesia prevents you from being alert and feeling pain during  a medical procedure. Your caregiver may recommend general anesthesia if your procedure:  Is long.  Is painful or uncomfortable.  Would be frightening to see or hear.  Requires you to be still.  Affects your breathing.  Causes significant blood loss. LET YOUR CAREGIVER KNOW ABOUT:  Allergies to food or medicine.  Medicines taken, including vitamins, herbs, eyedrops, over-the-counter medicines, and creams.  Use of steroids (by mouth or creams).  Previous problems with anesthetics or numbing medicines, including problems experienced by relatives.  History of bleeding problems or blood clots.  Previous surgeries and types of anesthetics received.  Possibility of pregnancy, if this applies.  Use of cigarettes, alcohol, or illegal drugs.  Any health condition(s), especially diabetes, sleep apnea, and high blood pressure. RISKS AND COMPLICATIONS General anesthesia rarely causes complications. However, if complications do occur, they can be life threatening. Complications include:  A lung infection.  A stroke.  A heart attack.  Waking up during the procedure. When this occurs, the patient may be unable to move and communicate that he or she is awake. The patient may feel severe pain. Older adults and adults with serious medical problems are more likely to have complications than adults who are young and healthy. Some complications can be prevented by answering all of your caregiver's questions thoroughly and by following all pre-procedure instructions. It is important to tell your caregiver if any of the pre-procedure instructions, especially those related to diet, were not followed. Any food or liquid in the stomach can cause problems when you are under general anesthesia. BEFORE THE PROCEDURE  Ask your caregiver if you will have to spend the night at the hospital. If you will not have to spend the night, arrange to have an adult drive you and stay with you for 24  hours.  Follow your caregiver's instructions if you are taking dietary supplements or medicines. Your caregiver may tell you to stop taking them or to reduce your dosage.  Do not smoke for as long as possible before your procedure. If possible, stop smoking 3-6 weeks before the procedure.  Do not take new dietary supplements or medicines within 1 week of your procedure unless your caregiver approves them.  Do not eat within 8 hours of your procedure or as directed by your caregiver. Drink only clear liquids, such as water, black coffee (without milk or cream), and fruit juices (without pulp).  Do not drink within 3 hours of your procedure or as directed by your caregiver.  You may brush your teeth on the morning of the procedure, but make sure to spit out the toothpaste and water when finished. PROCEDURE  You will receive anesthetics through a mask, through an intravenous (IV) access tube, or through both. A doctor who specializes in anesthesia (anesthesiologist) or a nurse who specializes in anesthesia (nurse anesthetist) or both will stay  with you throughout the procedure to make sure you remain unconscious. He or she will also watch your blood pressure, pulse, and oxygen levels to make sure that the anesthetics do not cause any problems. Once you are asleep, a breathing tube or mask may be used to help you breathe. AFTER THE PROCEDURE You will wake up after the procedure is complete. You may be in the room where the procedure was performed or in a recovery area. You may have a sore throat if a breathing tube was used. You may also feel:  Dizzy.  Weak.  Drowsy.  Confused.  Nauseous.  Cold. These are all normal responses and can be expected to last for up to 24 hours after the procedure is complete. A caregiver will tell you when you are ready to go home. This will usually be when you are fully awake and in stable condition.   This information is not intended to replace advice given  to you by your health care provider. Make sure you discuss any questions you have with your health care provider.   Document Released: 06/23/2007 Document Revised: 04/06/2014 Document Reviewed: 07/15/2011 Elsevier Interactive Patient Education [employer]

## 2015-01-25 NOTE — H&P (Signed)
GYN H&P All questions asked and answered and pt still desires to proceed with BTL. Can proceed when OR is ready  Cornelia Copaharlie Jaquin Coy, Jr MD [facility]  Pager: [phone]

## 2015-01-25 NOTE — Op Note (Signed)
Operative Note   01/25/2015  PRE-OP DIAGNOSIS: Desire for permanent sterilization. BMI 40   POST-OP DIAGNOSIS: Same. Left simple ovarian cyst  SURGEON: Surgeon(s) and Role:    * Pablo Bingharlie Avni Traore, MD - Primary  ASSISTANT: None  ANESTHESIA: General and local  PROCEDURE: Laparoscopic bilateral tubal ligation via filshie clip, repair of cervical laceration  ESTIMATED BLOOD LOSS: 25mL  DRAINS: I/O cath for no UOP at beginning of case   TOTAL IV FLUIDS: 1L crystalloid  SPECIMENS: none   VTE PROPHYLAXIS: SCDs to the bilateral lower extremities  ANTIBIOTICS: not indicated  COMPLICATIONS: none  DISPOSITION: PACU - hemodynamically stable.  CONDITION: stable  FINDINGS: On laparoscopic survey of the abdomen revealed a grossly normal uterus, tubes, and stomach edge; also with grossly normal ovaries with 1cm simple appearing left ovarian cyst seen. No intra-abdominal adhesions were noted  PROCEDURE IN DETAIL: The patient was taken to the OR where anesthesia was administed. The patient was positioned in dorsal lithotomy in the [place] stirrups. The patient was then examined under anesthesia with the above noted findings. The patient was prepped and draped in the normal sterile fashion and the bladder was then drained. A Graves speculum was placed in the vagina and the anterior lip of the cervix was grasped with a single toothed tenaculum.  A Hulka uterine manipulator was then inserted in the uterus and uterine mobility was found to be satisfactory; the speculum was then removed.  After changing gloves, a palmer's point incision was then made after using local anesthesia and using the Veress needle, the aspiration and bubble test was negative with opening pressure of 8. The abdomen was then insufflated and the 10mm trocar placed and then the laparoscope with the above noted findings, with inspection below the site of entry negative for injury. Next, a 10mm umbilical Veress port was then placed  under direct visualization because the operative laparoscope wasn't long enough to reach to the fallopian tubes. Next, a blunt probe was inserted, and the bilateral tubes were traced out to their respective fimbriae, with the above noted findings. Next, a Filshie clip was loaded and the placed across the tube approximately one third of the way down the tube from the uterus on the left side. The placement was inspected and complete occlusion of the tube, with the bottom jaw on the outside of the mesosalpinx noted. This was down on the right tube on the 2nd attempt with the first attempt having incomplete occlusion. The carter thomson device was used to close the umbilical fascia with 0-vicryl and then the palmer's point port was then removed. The umbilical fascial stitch was then tied and the skin closed at both sites with 4-0 monocryl and liquiband. The Hulka was removed with a stitch placed on the anterior lip of the cervix (3-0 vicryl, figure of eight) for excellent hemostasis.  The patient tolerated the procedure well. All counts were correct x 2. The patient was transferred to the recovery room awake, alert and breathing independently.   Cornelia Copaharlie Norah Fick, Jr MD [facility]  Pager: [phone]

## 2015-01-25 NOTE — Anesthesia Preprocedure Evaluation (Signed)
Anesthesia Evaluation  Patient identified by MRN, date of birth, ID band Patient awake    Reviewed: Allergy & Precautions, NPO status , Patient's Chart, lab work & pertinent test results  Airway Mallampati: I  TM Distance: >3 FB Neck ROM: Full    Dental  (+) Teeth Intact   Pulmonary asthma , former smoker,  Mild asthma--rarely takes her inhaler but has brought it with her today, as instructed.   Pulmonary exam normal        Cardiovascular Exercise Tolerance: Good negative cardio ROS Normal cardiovascular exam     Neuro/Psych    GI/Hepatic negative GI ROS,   Endo/Other    Renal/GU      Musculoskeletal   Abdominal (+) + obese,  Abdomen: soft.    Peds  Hematology   Anesthesia Other Findings Family anesthesia "problem" was that her mother was slow to wake up after a GA. No hx to suggest MH or pseudocholinesterase deficiency.   She appears to have some developmental delay.  Reproductive/Obstetrics                             Anesthesia Physical Anesthesia Plan  ASA: III  Anesthesia Plan: General   Post-op Pain Management:    Induction: Intravenous  Airway Management Planned: Oral ETT  Additional Equipment:   Intra-op Plan:   Post-operative Plan: Extubation in OR  Informed Consent: I have reviewed the patients History and Physical, chart, labs and discussed the procedure including the risks, benefits and alternatives for the proposed anesthesia with the patient or authorized representative who has indicated his/her understanding and acceptance.     Plan Discussed with: CRNA  Anesthesia Plan Comments:         Anesthesia Quick Evaluation

## 2015-01-25 NOTE — Anesthesia Procedure Notes (Signed)
Procedure Name: Intubation Date/Time: 01/25/2015 1:31 PM Performed by: Chong SicilianLOPEZ, Runette Scifres Pre-anesthesia Checklist: Patient identified, Emergency Drugs available, Suction available, Patient being monitored and Timeout performed Patient Re-evaluated:Patient Re-evaluated prior to inductionOxygen Delivery Method: Circle system utilized Preoxygenation: Pre-oxygenation with 100% oxygen Intubation Type: IV induction Ventilation: Mask ventilation without difficulty Laryngoscope Size: Mac and 3 Tube type: Oral Tube size: 7.0 mm Number of attempts: 1 Airway Equipment and Method: Stylet Placement Confirmation: ETT inserted through vocal cords under direct vision,  positive ETCO2 and breath sounds checked- equal and bilateral Secured at: 21 cm Tube secured with: Tape Dental Injury: Teeth and Oropharynx as per pre-operative assessment

## 2015-01-25 NOTE — Transfer of Care (Signed)
Immediate Anesthesia Transfer of Care Note  Patient: Ashley Fernandez  Procedure(s) Performed: Procedure(s): LAPAROSCOPIC TUBAL LIGATION (Bilateral)  Patient Location: PACU  Anesthesia Type:General  Level of Consciousness: awake  Airway & Oxygen Therapy: Patient Spontanous Breathing and Patient connected to face mask oxygen  Post-op Assessment: Report given to RN and Post -op Vital signs reviewed and stable  Post vital signs: stable  Last Vitals:  Filed Vitals:   01/25/15 1515  BP: 127/90  Pulse: 89  Temp: 36.4 C  Resp: 11    Complications: No apparent anesthesia complications

## 2015-01-28 ENCOUNTER — Encounter: Payer: Self-pay | Admitting: Obstetrics and Gynecology

## 2016-04-07 ENCOUNTER — Emergency Department
Admission: EM | Admit: 2016-04-07 | Discharge: 2016-04-07 | Disposition: A | Discharge status: Home / Self Care | Payer: Medicare Other | Attending: Emergency Medicine | Admitting: Emergency Medicine

## 2016-04-07 ENCOUNTER — Encounter: Payer: Self-pay | Admitting: Emergency Medicine

## 2016-04-07 DIAGNOSIS — Z79899 Other long term (current) drug therapy: Secondary | ICD-10-CM | POA: Insufficient documentation

## 2016-04-07 DIAGNOSIS — M25512 Pain in left shoulder: Secondary | ICD-10-CM | POA: Insufficient documentation

## 2016-04-07 DIAGNOSIS — M79632 Pain in left forearm: Secondary | ICD-10-CM | POA: Diagnosis not present

## 2016-04-07 DIAGNOSIS — Y929 Unspecified place or not applicable: Secondary | ICD-10-CM | POA: Insufficient documentation

## 2016-04-07 DIAGNOSIS — Y999 Unspecified external cause status: Secondary | ICD-10-CM | POA: Diagnosis not present

## 2016-04-07 DIAGNOSIS — S4992XA Unspecified injury of left shoulder and upper arm, initial encounter: Secondary | ICD-10-CM | POA: Diagnosis present

## 2016-04-07 DIAGNOSIS — J45909 Unspecified asthma, uncomplicated: Secondary | ICD-10-CM | POA: Diagnosis not present

## 2016-04-07 DIAGNOSIS — Y9389 Activity, other specified: Secondary | ICD-10-CM | POA: Insufficient documentation

## 2016-04-07 DIAGNOSIS — Z87891 Personal history of nicotine dependence: Secondary | ICD-10-CM | POA: Diagnosis not present

## 2016-04-07 MED ORDER — OXYCODONE-ACETAMINOPHEN 5-325 MG PO TABS
1.0000 | ORAL_TABLET | Freq: Once | ORAL | Status: AC
Start: 2016-04-07 — End: 2016-04-07
  Administered 2016-04-07: 1 via ORAL
  Filled 2016-04-07: qty 1

## 2016-04-07 MED ORDER — TRAMADOL HCL 50 MG PO TABS: 50.0000 mg | ORAL_TABLET | Freq: Four times a day (QID) | ORAL | 0 refills | Status: AC | PRN
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
## 2016-06-08 ENCOUNTER — Emergency Department
Admission: EM | Admit: 2016-06-08 | Discharge: 2016-06-08 | Disposition: A | Discharge status: Home / Self Care | Payer: No Typology Code available for payment source | Attending: Emergency Medicine | Admitting: Emergency Medicine

## 2016-06-08 ENCOUNTER — Encounter: Payer: Self-pay | Admitting: Emergency Medicine

## 2016-06-08 DIAGNOSIS — Z79899 Other long term (current) drug therapy: Secondary | ICD-10-CM | POA: Diagnosis not present

## 2016-06-08 DIAGNOSIS — Z91010 Allergy to peanuts: Secondary | ICD-10-CM | POA: Diagnosis not present

## 2016-06-08 DIAGNOSIS — Y92410 Unspecified street and highway as the place of occurrence of the external cause: Secondary | ICD-10-CM | POA: Insufficient documentation

## 2016-06-08 DIAGNOSIS — J45909 Unspecified asthma, uncomplicated: Secondary | ICD-10-CM | POA: Insufficient documentation

## 2016-06-08 DIAGNOSIS — Z791 Long term (current) use of non-steroidal anti-inflammatories (NSAID): Secondary | ICD-10-CM | POA: Insufficient documentation

## 2016-06-08 DIAGNOSIS — S01112A Laceration without foreign body of left eyelid and periocular area, initial encounter: Secondary | ICD-10-CM | POA: Diagnosis not present

## 2016-06-08 DIAGNOSIS — S0990XA Unspecified injury of head, initial encounter: Secondary | ICD-10-CM

## 2016-06-08 DIAGNOSIS — Y999 Unspecified external cause status: Secondary | ICD-10-CM | POA: Diagnosis not present

## 2016-06-08 DIAGNOSIS — Y939 Activity, unspecified: Secondary | ICD-10-CM | POA: Diagnosis not present

## 2016-06-08 DIAGNOSIS — S0083XA Contusion of other part of head, initial encounter: Secondary | ICD-10-CM

## 2016-06-08 DIAGNOSIS — Z87891 Personal history of nicotine dependence: Secondary | ICD-10-CM | POA: Diagnosis not present

## 2016-06-08 MED ORDER — ONDANSETRON 4 MG PO TBDP
4.0000 mg | ORAL_TABLET | Freq: Once | ORAL | Status: AC
  Administered 2016-06-08: 4 mg via ORAL
  Filled 2016-06-08: qty 1
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
## 2016-06-08 NOTE — Discharge Instructions (Signed)

## 2016-06-12 ENCOUNTER — Emergency Department: Payer: No Typology Code available for payment source

## 2016-06-12 ENCOUNTER — Emergency Department
Admission: EM | Admit: 2016-06-12 | Discharge: 2016-06-12 | Disposition: A | Discharge status: Home / Self Care | Payer: No Typology Code available for payment source | Attending: Emergency Medicine | Admitting: Emergency Medicine

## 2016-06-12 ENCOUNTER — Encounter: Payer: Self-pay | Admitting: Emergency Medicine

## 2016-06-12 DIAGNOSIS — Z79899 Other long term (current) drug therapy: Secondary | ICD-10-CM | POA: Insufficient documentation

## 2016-06-12 DIAGNOSIS — S0083XA Contusion of other part of head, initial encounter: Secondary | ICD-10-CM | POA: Diagnosis not present

## 2016-06-12 DIAGNOSIS — J45909 Unspecified asthma, uncomplicated: Secondary | ICD-10-CM | POA: Insufficient documentation

## 2016-06-12 DIAGNOSIS — Y92410 Unspecified street and highway as the place of occurrence of the external cause: Secondary | ICD-10-CM | POA: Diagnosis not present

## 2016-06-12 DIAGNOSIS — Y999 Unspecified external cause status: Secondary | ICD-10-CM | POA: Diagnosis not present

## 2016-06-12 DIAGNOSIS — Z87891 Personal history of nicotine dependence: Secondary | ICD-10-CM | POA: Diagnosis not present

## 2016-06-12 DIAGNOSIS — S301XXA Contusion of abdominal wall, initial encounter: Secondary | ICD-10-CM | POA: Insufficient documentation

## 2016-06-12 DIAGNOSIS — S40012A Contusion of left shoulder, initial encounter: Secondary | ICD-10-CM | POA: Diagnosis not present

## 2016-06-12 DIAGNOSIS — S0012XA Contusion of left eyelid and periocular area, initial encounter: Secondary | ICD-10-CM

## 2016-06-12 DIAGNOSIS — Y939 Activity, unspecified: Secondary | ICD-10-CM | POA: Insufficient documentation

## 2016-06-12 DIAGNOSIS — S0990XA Unspecified injury of head, initial encounter: Secondary | ICD-10-CM | POA: Diagnosis present

## 2016-06-12 DIAGNOSIS — G44319 Acute post-traumatic headache, not intractable: Secondary | ICD-10-CM

## 2016-06-12 MED ORDER — DIPHENHYDRAMINE HCL 50 MG/ML IJ SOLN
50.0000 mg | Freq: Once | INTRAMUSCULAR | Status: AC
  Administered 2016-06-12: 50 mg via INTRAMUSCULAR
  Filled 2016-06-12: qty 1

## 2016-06-12 MED ORDER — KETOROLAC TROMETHAMINE 30 MG/ML IJ SOLN
30.0000 mg | Freq: Once | INTRAMUSCULAR | Status: AC
  Administered 2016-06-12: 30 mg via INTRAMUSCULAR
  Filled 2016-06-12: qty 1

## 2016-06-12 MED ORDER — PROMETHAZINE HCL 25 MG/ML IJ SOLN
25.0000 mg | Freq: Once | INTRAMUSCULAR | Status: AC
  Administered 2016-06-12: 25 mg via INTRAMUSCULAR
  Filled 2016-06-12: qty 1

## 2016-06-12 MED ORDER — MELOXICAM 15 MG PO TABS: 15.0000 mg | ORAL_TABLET | Freq: Every day | ORAL | 0 refills | Status: AC
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
